# Patient Record
Sex: Male | Born: 1945 | Race: Black or African American | Hispanic: No | Smoking: Never smoker
Health system: Southern US, Community
[De-identification: ages and names within clinical notes are randomized; demographics above are authoritative.]

## PROBLEM LIST (undated history)

## (undated) DIAGNOSIS — H6120 Impacted cerumen, unspecified ear: Secondary | ICD-10-CM

## (undated) DIAGNOSIS — I10 Essential (primary) hypertension: Secondary | ICD-10-CM

## (undated) DIAGNOSIS — N400 Enlarged prostate without lower urinary tract symptoms: Secondary | ICD-10-CM

## (undated) DIAGNOSIS — F419 Anxiety disorder, unspecified: Secondary | ICD-10-CM

## (undated) DIAGNOSIS — F32A Depression, unspecified: Secondary | ICD-10-CM

## (undated) DIAGNOSIS — I639 Cerebral infarction, unspecified: Secondary | ICD-10-CM

## (undated) DIAGNOSIS — E669 Obesity, unspecified: Secondary | ICD-10-CM

## (undated) DIAGNOSIS — F329 Major depressive disorder, single episode, unspecified: Secondary | ICD-10-CM

## (undated) DIAGNOSIS — E7800 Pure hypercholesterolemia, unspecified: Secondary | ICD-10-CM

## (undated) HISTORY — DX: Impacted cerumen, unspecified ear: H61.20

## (undated) HISTORY — DX: Depression, unspecified: F32.A

## (undated) HISTORY — DX: Obesity, unspecified: E66.9

## (undated) HISTORY — DX: Pure hypercholesterolemia, unspecified: E78.00

## (undated) HISTORY — DX: Essential (primary) hypertension: I10

## (undated) HISTORY — DX: Major depressive disorder, single episode, unspecified: F32.9

## (undated) HISTORY — DX: Anxiety disorder, unspecified: F41.9

## (undated) HISTORY — DX: Benign prostatic hyperplasia without lower urinary tract symptoms: N40.0

---

## 1995-03-27 HISTORY — PX: TOTAL HIP ARTHROPLASTY: SHX124

## 2002-07-23 ENCOUNTER — Ambulatory Visit (HOSPITAL_COMMUNITY): Admission: RE | Admit: 2002-07-23 | Discharge: 2002-07-23 | Payer: Self-pay | Admitting: Family Medicine

## 2002-07-23 ENCOUNTER — Encounter: Payer: Self-pay | Admitting: Family Medicine

## 2002-08-05 ENCOUNTER — Ambulatory Visit (HOSPITAL_COMMUNITY): Admission: RE | Admit: 2002-08-05 | Discharge: 2002-08-05 | Payer: Self-pay | Admitting: Orthopaedic Surgery

## 2002-08-05 ENCOUNTER — Encounter: Payer: Self-pay | Admitting: Orthopaedic Surgery

## 2003-02-08 ENCOUNTER — Ambulatory Visit (HOSPITAL_COMMUNITY): Admission: RE | Admit: 2003-02-08 | Discharge: 2003-02-08 | Payer: Self-pay | Admitting: Family Medicine

## 2003-09-06 ENCOUNTER — Ambulatory Visit (HOSPITAL_COMMUNITY): Admission: RE | Admit: 2003-09-06 | Discharge: 2003-09-06 | Payer: Self-pay | Admitting: Family Medicine

## 2003-09-20 ENCOUNTER — Ambulatory Visit (HOSPITAL_COMMUNITY): Admission: RE | Admit: 2003-09-20 | Discharge: 2003-09-20 | Payer: Self-pay | Admitting: Family Medicine

## 2003-09-29 ENCOUNTER — Ambulatory Visit (HOSPITAL_COMMUNITY): Admission: RE | Admit: 2003-09-29 | Discharge: 2003-09-29 | Payer: Self-pay | Admitting: Family Medicine

## 2003-11-05 ENCOUNTER — Encounter (HOSPITAL_COMMUNITY): Admission: RE | Admit: 2003-11-05 | Discharge: 2003-12-05 | Payer: Self-pay | Admitting: Neurosurgery

## 2004-03-26 HISTORY — PX: COLONOSCOPY: SHX174

## 2004-11-01 ENCOUNTER — Ambulatory Visit: Payer: Self-pay | Admitting: Internal Medicine

## 2004-11-14 ENCOUNTER — Ambulatory Visit: Payer: Self-pay | Admitting: Internal Medicine

## 2004-11-14 ENCOUNTER — Ambulatory Visit (HOSPITAL_COMMUNITY): Admission: RE | Admit: 2004-11-14 | Discharge: 2004-11-14 | Payer: Self-pay | Admitting: Internal Medicine

## 2006-01-28 ENCOUNTER — Emergency Department (HOSPITAL_COMMUNITY): Admission: EM | Admit: 2006-01-28 | Discharge: 2006-01-28 | Payer: Self-pay | Admitting: *Deleted

## 2006-08-07 ENCOUNTER — Emergency Department (HOSPITAL_COMMUNITY): Admission: EM | Admit: 2006-08-07 | Discharge: 2006-08-07 | Payer: Self-pay | Admitting: *Deleted

## 2010-08-11 NOTE — Consult Note (Signed)
NAME:  Stanley Wall, Stanley Wall              ACCOUNT NO.:  [id number]   MEDICAL RECORD NO.:  [id number]         PATIENT TYPE:  AMB   LOCATION:                                FACILITY:  [facility]   PHYSICIAN:  R. Roetta Sessions, M.D. DATE OF BIRTH:  28-Feb-1946   DATE OF CONSULTATION:  11/01/2004  DATE OF DISCHARGE:                                   CONSULTATION   REASON FOR CONSULTATION:  Intermittent hematochezia.   HISTORY OF PRESENT ILLNESS:  Stanley Wall is a 65 year old, African-  American male who reports a 4- to 21-month history of bubbling sensation to  his entire abdomen.  He notes increased bowel sounds.  He also complains of  constipation going anywhere from 2-3 days without a bowel movement.  He has  been taking black draw for his constipation.  He did notice a small amount  of bright red blood in his stool about 3 days ago.  Prior to this, he was  having daily bowel movements.  He denies any nausea, vomiting or diarrhea.  He denies any indigestion, dysphagia, odynophagia or heartburn currently,  although he has history of heartburn and atypical chest pain which is well-  controlled on Protonix for the last 2 years.   PAST MEDICAL HISTORY:  1.  Hypertension.  2.  GERD.  3.  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  4.  Oral surgery.  5.  Hip surgery in 1997.   CURRENT MEDICATIONS:  1.  Protonix 40 mg daily.  2.  Diovan 80 mg daily.  3.  Restoril 4 mg q.h.s.  4.  Hydroxyzine HCl 10 mg q.6h.  5.  Norvasc 10 mg daily.  6.  Celebrex 200 mg b.i.d.  7.  Hyoscyamine 0.375 mg b.i.d. p.r.n.  8.  Xanax 0.5 mg q.6h. p.r.n.  9.  Gas-X p.r.n.  10. Fluocinonide cream 0.5% b.i.d.  11. Diprolene AP p.r.n.  12. Nystatin t.i.d.  13. Tylenol p.r.n.  14. Aspirin 325 mg p.r.n.   ALLERGIES:  No known drug allergies.   FAMILY HISTORY:  No family history of colorectal carcinoma, liver or chronic  GI problems.   SOCIAL HISTORY:  Stanley Wall is currently divorced.  He lives alone.  He  has one grown,  healthy son.  He is retired from [employer].  He denies any  tobacco, alcohol or drug use.   REVIEW OF SYSTEMS:  CONSTITUTIONAL:  Weight is steadily increasingly.  Denies any fatigue, denies fevers and chills.  He does have insomnia.  CARDIOVASCULAR:  Denies any chest pain or palpitations.  PULMONARY:  Denies  shortness of breath, dyspnea, cough or hemoptysis.  GASTROINTESTINAL:  See  HPI.   PHYSICAL EXAMINATION:  VITAL SIGNS:  Weight 301 pounds, height 69 inches,  temperature 98.3, blood pressure 144/88 and pulse 76.  GENERAL:  Stanley Wall is a 65 year old, obese, African-American male who is  alert, pleasant and cooperative in no acute distress.  HEENT:  Sclerae clear.  Nonicteric.  Conjunctivae pink.  Oropharynx pink and  moist without any lesions.  NECK:  Supple without any masses or thyromegaly.  CHEST:  Heart regular rate and  rhythm with normal S1, S2 without murmurs,  rubs or gallops.  LUNGS:  Clear to auscultation bilaterally.  ABDOMEN:  Protuberant and distended with positive, very active bowel sounds  x4.  No bruits auscultated.  Soft, nontender without any masses or  hepatosplenomegaly.  No rebound tenderness or guarding.  RECTAL:  Deferred.  EXTREMITIES:  1+ lower extremity edema bilaterally.   IMPRESSION:  Stanley Wall is a 65 year old, African-American male with  intermittent hematochezia.  He has never had a screening colonoscopy and I  feel at this time he should undergo exam to rule out colorectal carcinoma.  He also reports history of constipation.  It is possible that he could be  having intermittent obstruction as well.   PLAN:  1.  Will schedule colonoscopy with Dr. Jena Gauss in the near future.  I have      discussed this procedure including risks and benefits including, but not      limited to bleeding, infection, perforation or drug reaction giving      informed consent.  2.  Recommendations to follow.   We would like to thank Dr. Regino Schultze for allowing [place] to  participate in the  care of Stanley Wall.      Nicholas Lose, N.P.      Jonathon Bellows, M.D.  Electronically Signed    KC/MEDQ  D:  11/01/2004  T:  11/01/2004  Job:  [id number]

## 2010-08-11 NOTE — Op Note (Signed)
NAME:  Stanley Wall, Stanley Wall              ACCOUNT NO.:  [id number]   MEDICAL RECORD NO.:  [id number]          PATIENT TYPE:  AMB   LOCATION:  DAY                           FACILITY:  [facility]   PHYSICIAN:  R. Roetta Sessions, M.D. DATE OF BIRTH:  April 18, 1945   DATE OF PROCEDURE:  11/14/2004  DATE OF DISCHARGE:                                 OPERATIVE REPORT   PROCEDURE:  Diagnostic colonoscopy.   INDICATIONS FOR PROCEDURE:  A 65 year old African-American male with  hematochezia. Occasionally has this in the setting of constipation. There  has been no abdominal pain. He has never had his lower GI tract imaged.  There is no family history of colorectal neoplasia. Colonoscopy is now being  done. This approach has been discussed with the patient at length. Potential  risks, benefits, and alternatives have been reviewed and questions answered.  He is agreeable. Please see documentation in the medical record.   PROCEDURE NOTE:  O2 saturation, blood pressure, pulse, and respirations were  monitored throughout the entire procedure. Conscious sedation with Versed 3  mg IV and Demerol 75 mg IV in divided doses.   INSTRUMENT:  Olympus video chip system.   FINDINGS:  Digital rectal exam revealed no abnormalities.   ENDOSCOPIC FINDINGS:  Prep was good.   Rectum:  Examination of the rectal mucosa including retroflexed view of the  anal verge revealed no abnormalities although there appeared to be some  minimal anal canal hemorrhoid tissue ______________ as the anal canal was  traversed.   Colon:  Colonic mucosa was surveyed from the rectosigmoid junction through  the left, transverse, and right colon to the area of the appendiceal  orifice, ileocecal valve, and cecum. These structures were well seen and  photographed for the record. From this level, the scope was slowly  withdrawn, and all previously mentioned mucosal surfaces were again seen.  The colonic mucosa appeared normal. The patient tolerated  the procedure well  and was reactive to endoscopy.   IMPRESSION:  Anal canal hemorrhoids. Otherwise rectum. Normal colon. I  suspect the patient's had bled from anal canal hemorrhoids intermittent.   RECOMMENDATIONS:  1.  Hemorrhoid and constipation literature provided to Mr. Preiss.  2.  Anusol HC suppositories 1 per rectum at bedtime for 10 days.  3.  MiraLax laxatives 17 g orally at bedtime p.r.n. constipation.  4.  If bleeding does not resolve, he is to let me know.      Stanley Wall, M.D.  Electronically Signed     RMR/MEDQ  D:  11/14/2004  T:  11/14/2004  Job:  [id number]

## 2014-11-17 ENCOUNTER — Telehealth: Payer: Self-pay

## 2014-11-17 NOTE — Telephone Encounter (Signed)
Pt was referred by Dr. Phillips Odor for screening colonoscopy. Last one was 11/14/2004 by Dr. Jena Gauss. I have scheduled him an OV with Wynne Dust, NP on 11/22/2014 at 10:00 Am.

## 2014-11-22 ENCOUNTER — Ambulatory Visit (INDEPENDENT_AMBULATORY_CARE_PROVIDER_SITE_OTHER): Payer: Medicare Other | Admitting: Nurse Practitioner

## 2014-11-22 ENCOUNTER — Encounter: Payer: Self-pay | Admitting: Nurse Practitioner

## 2014-11-22 ENCOUNTER — Other Ambulatory Visit: Payer: Self-pay

## 2014-11-22 VITALS — BP 139/88 | HR 68 | Temp 98.1°F | Ht 69.0 in | Wt 242.8 lb

## 2014-11-22 DIAGNOSIS — Z1211 Encounter for screening for malignant neoplasm of colon: Secondary | ICD-10-CM | POA: Insufficient documentation

## 2014-11-22 DIAGNOSIS — K5900 Constipation, unspecified: Secondary | ICD-10-CM | POA: Diagnosis not present

## 2014-11-22 DIAGNOSIS — K921 Melena: Secondary | ICD-10-CM | POA: Insufficient documentation

## 2014-11-22 MED ORDER — PEG-KCL-NACL-NASULF-NA ASC-C 100 G PO SOLR: 1.0000 | ORAL | Status: AC

## 2014-11-22 NOTE — Assessment & Plan Note (Signed)
Last colonoscopy 11/14/2004 for hematochezia with constipation, findings include normal rectum and colon, likely hematochezia due to hemorrhoids and constipation. At this point he is due for a 10 year screening colonoscopy.  Proceed with TCS in the OR with propofol with Dr. Jena Gauss in near future: the risks, benefits, and alternatives have been discussed with the patient in detail. The patient states understanding and desires to proceed.  Patient is not on any anticoagulants, he is on Xanax twice daily, Risperdal, and Celexa. Due to polypharmacy we will recommend procedure and the OR with propofol to ensure adequate sedation.

## 2014-11-22 NOTE — Assessment & Plan Note (Signed)
Patient with chronic constipation currently well-controlled on over-the-counter Dulcolax. Has occasional worsening of constipation. Recommend he continue taking when necessary Dulcolax to prevent constipation.

## 2014-11-22 NOTE — Assessment & Plan Note (Signed)
Patient with rare, scant toilet tissue hematochezia in the setting of constipation with known hemorrhoids. Last episode 2 months ago. He is on Dulcolax for his constipation symptoms. Toilet tissue hematochezia likely due to hemorrhoids and constipation. However he is due for a 10 year screening colonoscopy. Continue Dulcolax as needed.

## 2014-11-22 NOTE — Patient Instructions (Signed)
1. Continue taking the Dulcolax to help keep her constipation under control. 2. We will schedule your procedure for you. 3. Further recommendations to be based on results of your procedure.

## 2014-11-22 NOTE — Progress Notes (Signed)
Primary Care Physician:  Lenise Herald, PA-C Primary Gastroenterologist:  Dr. Jena Gauss  Chief Complaint  Patient presents with  . set up TCS    HPI:   69 year old male referred by PCP for colonoscopy. PCP note reviewed. Patient was brought in for an office visit rather than from triage due to concerns by medications. Last colonoscopy completed 11/14/2004 for complaints of occasional hematochezia in the setting of constipation. On exam include anal canal hemorrhoids, otherwise normal rectum, normal colon, likely hematochezia from hemorrhoidal bleeding. Was given MiraLAX, Anusol suppositories.   Today he states he's doing well overall. Denies abdominal pain, N/V, hematochezia as of late, last episode of hematochezia about 2 months ago which was scant toilet tissue hematochezia. Denies other hemorrhoid symptoms. Constipation symptoms generally well controlled, currently taking an OTC dulcolax prn. Denies melena, unintentional weight loss, unexplained fevers. Denies chest pain, dyspnea, dizziness, lightheadedness, syncope, near syncope. Denies any other upper or lower GI symptoms.   No past medical history on file.  No past surgical history on file.  Current Outpatient Prescriptions  Medication Sig Dispense Refill  . ALPRAZolam (XANAX) 0.5 MG tablet Take 0.5 mg by mouth 2 (two) times daily.    Marland Kitchen amLODipine (NORVASC) 10 MG tablet Take 10 mg by mouth daily.  1  . atorvastatin (LIPITOR) 10 MG tablet Take 10 mg by mouth daily.  0  . betamethasone dipropionate (DIPROLENE) 0.05 % cream Apply topically 2 (two) times daily as needed.  1  . celecoxib (CELEBREX) 200 MG capsule Take 400 mg by mouth daily.  3  . mupirocin ointment (BACTROBAN) 2 % Place 1 application into the nose 2 (two) times daily.    Marland Kitchen NAFTIN 1 % GEL APPLY GEL TO FEET DAILY AS DIRECTED  11  . QUEtiapine (SEROQUEL) 100 MG tablet Take 100 mg by mouth 2 (two) times daily.  3  . risperiDONE (RISPERDAL) 2 MG tablet Take 2 mg by mouth at  bedtime.  0  . tamsulosin (FLOMAX) 0.4 MG CAPS capsule Take 0.4 mg by mouth daily.  3  . valsartan (DIOVAN) 160 MG tablet Take 160 mg by mouth daily.  3   No current facility-administered medications for this visit.    Allergies as of 11/22/2014  . (No Known Allergies)    Family History  Problem Relation Age of Onset  . Colon cancer Neg Hx     Social History   Social History  . Marital Status: Divorced    Spouse Name: N/A  . Number of Children: N/A  . Years of Education: N/A   Occupational History  . Not on file.   Social History Main Topics  . Smoking status: Never Smoker   . Smokeless tobacco: Never Used  . Alcohol Use: No  . Drug Use: No  . Sexual Activity: Not on file   Other Topics Concern  . Not on file   Social History Narrative  . No narrative on file    Review of Systems: General: Negative for anorexia, weight loss, fever, chills, fatigue, weakness. Eyes: Negative for vision changes.  ENT: Negative for hoarseness, difficulty swallowing. CV: Negative for chest pain, angina, palpitations, peripheral edema.  Respiratory: Negative for dyspnea at rest, cough, sputum, wheezing.  GI: See history of present illness. Derm: Negative for rash or itching.  Endo: Negative for unusual weight change.  Heme: Negative for bruising or bleeding. Allergy: Negative for rash or hives.    Physical Exam: BP 139/88 mmHg  Pulse 68  Temp(Src) 98.1  F (36.7 C)  Ht  (1.753 m)  Wt 242 lb 12.8 oz (110.133 kg)  BMI 35.84 kg/m2 General:   Alert and oriented. Pleasant and cooperative. Well-nourished and well-developed.  Head:  Normocephalic and atraumatic. Eyes:  Without icterus, sclera clear and conjunctiva pink.  Ears:  Normal auditory acuity. Cardiovascular:  S1, S2 present with systolic murmur appreciated. . Extremities without clubbing or edema noted. Respiratory:  Clear to auscultation bilaterally. No wheezes, rales, or rhonchi. No distress.  Gastrointestinal:   +BS, rounded but soft, non-tender and non-distended. No HSM noted. No guarding or rebound. No masses appreciated.  Rectal:  Deferred  Musculoskalatal:  Hobbling gait noted, denies pain. Skin:  Intact without significant lesions or rashes. Neurologic:  Alert and oriented x4;  grossly normal neurologically. Psych:  Alert and cooperative. Normal mood and affect. Heme/Lymph/Immune: No excessive bruising noted.    11/22/2014 10:43 AM

## 2014-11-23 NOTE — Progress Notes (Signed)
CC'ED TO PCP 

## 2014-12-03 NOTE — Patient Instructions (Signed)
Stanley Wall  12/03/2014     @PREFPERIOPPHARMACY @   Your procedure is scheduled on  12/09/2014  Report to [facility] at  730  A.M.  Call this number if you have problems the morning of surgery:  [phone]   Remember:  Do not eat food or drink liquids after midnight.  Take these medicines the morning of surgery with A SIP OF WATER  Xanax, norvasc, celebrex, seroquel, fomax, valsartan.   Do not wear jewelry, make-up or nail polish.  Do not wear lotions, powders, or perfumes.  You may wear deodorant.  Do not shave 48 hours prior to surgery.  Men may shave face and neck.  Do not bring valuables to the hospital.  [facility] is not responsible for any belongings or valuables.  Contacts, dentures or bridgework may not be worn into surgery.  Leave your suitcase in the car.  After surgery it may be brought to your room.  For patients admitted to the hospital, discharge time will be determined by your treatment team.  Patients discharged the day of surgery will not be allowed to drive home.   Name and phone number of your driver:   family Special instructions:  Follow the diet and prep instructions given to you by Dr Luvenia Starch office.  Please read over the following fact sheets that you were given. Pain Booklet, Coughing and Deep Breathing, Surgical Site Infection Prevention, Anesthesia Post-op Instructions and Care and Recovery After Surgery      Colonoscopy A colonoscopy is an exam to look at the entire large intestine (colon). This exam can help find problems such as tumors, polyps, inflammation, and areas of bleeding. The exam takes about 1 hour.  LET [facility] CARE PROVIDER KNOW ABOUT:   Any allergies you have.  All medicines you are taking, including vitamins, herbs, eye drops, creams, and over-the-counter medicines.  Previous problems you or members of your family have had with the use of anesthetics.  Any blood disorders you have.  Previous surgeries you  have had.  Medical conditions you have. RISKS AND COMPLICATIONS  Generally, this is a safe procedure. However, as with any procedure, complications can occur. Possible complications include:  Bleeding.  Tearing or rupture of the colon wall.  Reaction to medicines given during the exam.  Infection (rare). BEFORE THE PROCEDURE   Ask your health care provider about changing or stopping your regular medicines.  You may be prescribed an oral bowel prep. This involves drinking a large amount of medicated liquid, starting the day before your procedure. The liquid will cause you to have multiple loose stools until your stool is almost clear or light green. This cleans out your colon in preparation for the procedure.  Do not eat or drink anything else once you have started the bowel prep, unless your health care provider tells you it is safe to do so.  Arrange for someone to drive you home after the procedure. PROCEDURE   You will be given medicine to help you relax (sedative).  You will lie on your side with your knees bent.  A long, flexible tube with a light and camera on the end (colonoscope) will be inserted through the rectum and into the colon. The camera sends video back to a computer screen as it moves through the colon. The colonoscope also releases carbon dioxide gas to inflate the colon. This helps your health care provider see the area better.  During the exam, your  health care provider may take a small tissue sample (biopsy) to be examined under a microscope if any abnormalities are found.  The exam is finished when the entire colon has been viewed. AFTER THE PROCEDURE   Do not drive for 24 hours after the exam.  You may have a small amount of blood in your stool.  You may pass moderate amounts of gas and have mild abdominal cramping or bloating. This is caused by the gas used to inflate your colon during the exam.  Ask when your test results will be ready and how you  will get your results. Make sure you get your test results. Document Released: 03/09/2000 Document Revised: 12/31/2012 Document Reviewed: 11/17/2012 [facility] Patient Information 2015 [place], [place]. This information is not intended to replace advice given to you by your health care provider. Make sure you discuss any questions you have with your health care provider. PATIENT INSTRUCTIONS POST-ANESTHESIA  IMMEDIATELY FOLLOWING SURGERY:  Do not drive or operate machinery for the first twenty four hours after surgery.  Do not make any important decisions for twenty four hours after surgery or while taking narcotic pain medications or sedatives.  If you develop intractable nausea and vomiting or a severe headache please notify your doctor immediately.  FOLLOW-UP:  Please make an appointment with your surgeon as instructed. You do not need to follow up with anesthesia unless specifically instructed to do so.  WOUND CARE INSTRUCTIONS (if applicable):  Keep a dry clean dressing on the anesthesia/puncture wound site if there is drainage.  Once the wound has quit draining you may leave it open to air.  Generally you should leave the bandage intact for twenty four hours unless there is drainage.  If the epidural site drains for more than 36-48 hours please call the anesthesia department.  QUESTIONS?:  Please feel free to call your physician or the hospital operator if you have any questions, and they will be happy to assist you.

## 2014-12-06 ENCOUNTER — Other Ambulatory Visit: Payer: Self-pay

## 2014-12-06 ENCOUNTER — Encounter (HOSPITAL_COMMUNITY)
Admission: RE | Admit: 2014-12-06 | Discharge: 2014-12-06 | Disposition: A | Discharge status: Home / Self Care | Payer: Medicare Other | Source: Ambulatory Visit | Attending: Internal Medicine | Admitting: Internal Medicine

## 2014-12-06 ENCOUNTER — Encounter (HOSPITAL_COMMUNITY): Payer: Self-pay

## 2014-12-06 DIAGNOSIS — Q438 Other specified congenital malformations of intestine: Secondary | ICD-10-CM | POA: Diagnosis not present

## 2014-12-06 DIAGNOSIS — K648 Other hemorrhoids: Secondary | ICD-10-CM | POA: Diagnosis not present

## 2014-12-06 DIAGNOSIS — I10 Essential (primary) hypertension: Secondary | ICD-10-CM | POA: Diagnosis not present

## 2014-12-06 DIAGNOSIS — F418 Other specified anxiety disorders: Secondary | ICD-10-CM | POA: Diagnosis not present

## 2014-12-06 DIAGNOSIS — Z01810 Encounter for preprocedural cardiovascular examination: Secondary | ICD-10-CM | POA: Diagnosis not present

## 2014-12-06 DIAGNOSIS — Z01812 Encounter for preprocedural laboratory examination: Secondary | ICD-10-CM | POA: Diagnosis not present

## 2014-12-06 DIAGNOSIS — Z79899 Other long term (current) drug therapy: Secondary | ICD-10-CM | POA: Diagnosis not present

## 2014-12-06 DIAGNOSIS — Z1211 Encounter for screening for malignant neoplasm of colon: Secondary | ICD-10-CM | POA: Diagnosis present

## 2014-12-06 HISTORY — DX: Cerebral infarction, unspecified: I63.9

## 2014-12-06 LAB — CBC WITH DIFFERENTIAL/PLATELET
Basophils Absolute: 0 10*3/uL (ref 0.0–0.1)
Basophils Relative: 0 % (ref 0–1)
Eosinophils Absolute: 0.1 10*3/uL (ref 0.0–0.7)
Eosinophils Relative: 3 % (ref 0–5)
HCT: 42.4 % (ref 39.0–52.0)
Hemoglobin: 14.1 g/dL (ref 13.0–17.0)
Lymphocytes Relative: 25 % (ref 12–46)
Lymphs Abs: 1.2 10*3/uL (ref 0.7–4.0)
MCH: 30.7 pg (ref 26.0–34.0)
MCHC: 33.3 g/dL (ref 30.0–36.0)
MCV: 92.2 fL (ref 78.0–100.0)
Monocytes Absolute: 0.6 10*3/uL (ref 0.1–1.0)
Monocytes Relative: 12 % (ref 3–12)
Neutro Abs: 2.8 10*3/uL (ref 1.7–7.7)
Neutrophils Relative %: 60 % (ref 43–77)
Platelets: 225 10*3/uL (ref 150–400)
RBC: 4.6 MIL/uL (ref 4.22–5.81)
RDW: 14.6 % (ref 11.5–15.5)
WBC: 4.7 10*3/uL (ref 4.0–10.5)

## 2014-12-06 LAB — BASIC METABOLIC PANEL
Anion gap: 5 (ref 5–15)
BUN: 11 mg/dL (ref 6–20)
CO2: 26 mmol/L (ref 22–32)
Calcium: 8.5 mg/dL — ABNORMAL LOW (ref 8.9–10.3)
Chloride: 106 mmol/L (ref 101–111)
Creatinine, Ser: 0.96 mg/dL (ref 0.61–1.24)
GFR calc Af Amer: >60 mL/min (ref >60)
GFR calc non Af Amer: >60 mL/min (ref >60)
Glucose, Bld: 90 mg/dL (ref 65–99)
Potassium: 4.2 mmol/L (ref 3.5–5.1)
Sodium: 137 mmol/L (ref 135–145)

## 2014-12-06 NOTE — Pre-Procedure Instructions (Signed)
Patient given information to sign up for my chart at home. 

## 2014-12-09 ENCOUNTER — Ambulatory Visit (HOSPITAL_COMMUNITY): Payer: Medicare Other | Admitting: Anesthesiology

## 2014-12-09 ENCOUNTER — Ambulatory Visit (HOSPITAL_COMMUNITY)
Admission: RE | Admit: 2014-12-09 | Discharge: 2014-12-09 | Disposition: A | Discharge status: Home Health | Payer: Medicare Other | Source: Ambulatory Visit | Attending: Internal Medicine | Admitting: Internal Medicine

## 2014-12-09 ENCOUNTER — Encounter (HOSPITAL_COMMUNITY): Payer: Self-pay | Admitting: Certified Registered Nurse Anesthetist

## 2014-12-09 ENCOUNTER — Encounter (HOSPITAL_COMMUNITY)
Admission: RE | Disposition: A | Discharge status: Home Health | Payer: Self-pay | Source: Ambulatory Visit | Attending: Internal Medicine

## 2014-12-09 DIAGNOSIS — Q438 Other specified congenital malformations of intestine: Secondary | ICD-10-CM | POA: Insufficient documentation

## 2014-12-09 DIAGNOSIS — Z1211 Encounter for screening for malignant neoplasm of colon: Secondary | ICD-10-CM | POA: Diagnosis not present

## 2014-12-09 DIAGNOSIS — I10 Essential (primary) hypertension: Secondary | ICD-10-CM | POA: Diagnosis not present

## 2014-12-09 DIAGNOSIS — K648 Other hemorrhoids: Secondary | ICD-10-CM | POA: Insufficient documentation

## 2014-12-09 DIAGNOSIS — F418 Other specified anxiety disorders: Secondary | ICD-10-CM | POA: Insufficient documentation

## 2014-12-09 DIAGNOSIS — Z79899 Other long term (current) drug therapy: Secondary | ICD-10-CM | POA: Insufficient documentation

## 2014-12-09 DIAGNOSIS — Z01812 Encounter for preprocedural laboratory examination: Secondary | ICD-10-CM | POA: Insufficient documentation

## 2014-12-09 DIAGNOSIS — Z01810 Encounter for preprocedural cardiovascular examination: Secondary | ICD-10-CM | POA: Insufficient documentation

## 2014-12-09 HISTORY — PX: COLONOSCOPY WITH PROPOFOL: SHX5780

## 2014-12-09 SURGERY — COLONOSCOPY WITH PROPOFOL
Anesthesia: Monitor Anesthesia Care

## 2014-12-09 MED ORDER — MIDAZOLAM HCL 2 MG/2ML IJ SOLN
INTRAMUSCULAR | Status: AC
  Filled 2014-12-09: qty 2

## 2014-12-09 MED ORDER — FENTANYL CITRATE (PF) 100 MCG/2ML IJ SOLN
INTRAMUSCULAR | Status: AC
  Filled 2014-12-09: qty 4

## 2014-12-09 MED ORDER — PROPOFOL INFUSION 10 MG/ML OPTIME
INTRAVENOUS | Status: DC | PRN
  Administered 2014-12-09: 80 ug/kg/min via INTRAVENOUS

## 2014-12-09 MED ORDER — STERILE WATER FOR IRRIGATION IR SOLN
Status: DC | PRN
  Administered 2014-12-09: 1000 mL

## 2014-12-09 MED ORDER — PROPOFOL 10 MG/ML IV BOLUS
INTRAVENOUS | Status: AC
  Filled 2014-12-09: qty 20

## 2014-12-09 MED ORDER — ONDANSETRON HCL 4 MG/2ML IJ SOLN: 4.0000 mg | Freq: Once | INTRAMUSCULAR | Status: DC | PRN

## 2014-12-09 MED ORDER — MIDAZOLAM HCL 2 MG/2ML IJ SOLN
INTRAMUSCULAR | Status: AC
  Filled 2014-12-09: qty 4

## 2014-12-09 MED ORDER — GLYCOPYRROLATE 0.2 MG/ML IJ SOLN
0.2000 mg | Freq: Once | INTRAMUSCULAR | Status: AC
  Administered 2014-12-09: 0.2 mg via INTRAVENOUS

## 2014-12-09 MED ORDER — LIDOCAINE HCL (PF) 1 % IJ SOLN
INTRAMUSCULAR | Status: AC
  Filled 2014-12-09: qty 5

## 2014-12-09 MED ORDER — FENTANYL CITRATE (PF) 100 MCG/2ML IJ SOLN
INTRAMUSCULAR | Status: AC
  Filled 2014-12-09: qty 2

## 2014-12-09 MED ORDER — FENTANYL CITRATE (PF) 100 MCG/2ML IJ SOLN
25.0000 ug | INTRAMUSCULAR | Status: AC
  Administered 2014-12-09 (×2): 25 ug via INTRAVENOUS

## 2014-12-09 MED ORDER — FENTANYL CITRATE (PF) 100 MCG/2ML IJ SOLN: 25.0000 ug | INTRAMUSCULAR | Status: DC | PRN

## 2014-12-09 MED ORDER — ONDANSETRON HCL 4 MG/2ML IJ SOLN
4.0000 mg | Freq: Once | INTRAMUSCULAR | Status: AC
  Administered 2014-12-09: 4 mg via INTRAVENOUS

## 2014-12-09 MED ORDER — LIDOCAINE HCL 1 % IJ SOLN
INTRAMUSCULAR | Status: DC | PRN
  Administered 2014-12-09: 50 mg via INTRADERMAL

## 2014-12-09 MED ORDER — ONDANSETRON HCL 4 MG/2ML IJ SOLN
INTRAMUSCULAR | Status: AC
  Filled 2014-12-09: qty 2

## 2014-12-09 MED ORDER — GLYCOPYRROLATE 0.2 MG/ML IJ SOLN
INTRAMUSCULAR | Status: AC
  Filled 2014-12-09: qty 1

## 2014-12-09 MED ORDER — LACTATED RINGERS IV SOLN
INTRAVENOUS | Status: DC
  Administered 2014-12-09: 08:00:00 via INTRAVENOUS

## 2014-12-09 MED ORDER — MIDAZOLAM HCL 2 MG/2ML IJ SOLN
1.0000 mg | INTRAMUSCULAR | Status: DC | PRN
Start: 2014-12-09 — End: 2014-12-09
  Administered 2014-12-09 (×2): 2 mg via INTRAVENOUS
  Filled 2014-12-09: qty 2

## 2014-12-09 MED ORDER — PROPOFOL 10 MG/ML IV BOLUS
INTRAVENOUS | Status: DC | PRN
  Administered 2014-12-09: 80 mg via INTRAVENOUS

## 2014-12-09 SURGICAL SUPPLY — 23 items
ELECT REM PT RETURN 9FT ADLT (ELECTROSURGICAL)
ELECTRODE REM PT RTRN 9FT ADLT (ELECTROSURGICAL) IMPLANT
FCP BXJMBJMB 240X2.8X (CUTTING FORCEPS)
FLOOR PAD 36X40 (MISCELLANEOUS) ×3
FORCEPS BIOP RAD 4 LRG CAP 4 (CUTTING FORCEPS) IMPLANT
FORCEPS BIOP RJ4 240 W/NDL (CUTTING FORCEPS)
FORCEPS BXJMBJMB 240X2.8X (CUTTING FORCEPS) IMPLANT
FORMALIN 10 PREFIL 20ML (MISCELLANEOUS) IMPLANT
INJECTOR/SNARE I SNARE (MISCELLANEOUS) IMPLANT
KIT ENDO PROCEDURE PEN (KITS) ×3 IMPLANT
MANIFOLD NEPTUNE II (INSTRUMENTS) ×2 IMPLANT
NDL SCLEROTHERAPY 25GX240 (NEEDLE) IMPLANT
NEEDLE SCLEROTHERAPY 25GX240 (NEEDLE) IMPLANT
PAD FLOOR 36X40 (MISCELLANEOUS) IMPLANT
PROBE APC STR FIRE (PROBE) IMPLANT
PROBE INJECTION GOLD (MISCELLANEOUS)
PROBE INJECTION GOLD 7FR (MISCELLANEOUS) IMPLANT
SNARE ROTATE MED OVAL 20MM (MISCELLANEOUS) IMPLANT
SNARE SHORT THROW 13M SML OVAL (MISCELLANEOUS) IMPLANT
SYR 50ML LL SCALE MARK (SYRINGE) ×2 IMPLANT
TRAP SPECIMEN MUCOUS 40CC (MISCELLANEOUS) IMPLANT
TUBING IRRIGATION ENDOGATOR (MISCELLANEOUS) ×2 IMPLANT
WATER STERILE IRR 1000ML POUR (IV SOLUTION) ×2 IMPLANT

## 2014-12-09 NOTE — Anesthesia Postprocedure Evaluation (Signed)
  Anesthesia Post-op Note  Patient: Stanley Wall  Procedure(s) Performed: Procedure(s) with comments: COLONOSCOPY WITH PROPOFOL (N/A) - Cecum time in  0953  time out  1000   total time 7 minutes  Patient Location: PACU  Anesthesia Type:MAC  Level of Consciousness: awake, alert , oriented, pateint uncooperative and responds to stimulation  Airway and Oxygen Therapy: Patient Spontanous Breathing and Patient connected to face mask oxygen  Post-op Pain: none  Post-op Assessment: Post-op Vital signs reviewed, Patient's Cardiovascular Status Stable, Respiratory Function Stable, Patent Airway, No signs of Nausea or vomiting and Pain level controlled              Post-op Vital Signs: Reviewed and stable  Last Vitals:  Filed Vitals:   12/09/14 0936  BP: 125/87  Pulse:   Temp:   Resp: 16    Complications: No apparent anesthesia complications

## 2014-12-09 NOTE — Transfer of Care (Signed)
Immediate Anesthesia Transfer of Care Note  Patient: Stanley Wall  Procedure(s) Performed: Procedure(s) with comments: COLONOSCOPY WITH PROPOFOL (N/A) - Cecum time in  0953  time out  1000   total time 7 minutes  Patient Location: PACU  Anesthesia Type:MAC  Level of Consciousness: awake, alert , oriented and patient cooperative  Airway & Oxygen Therapy: Patient Spontanous Breathing and Patient connected to face mask oxygen  Post-op Assessment: Report given to RN, Post -op Vital signs reviewed and stable and Patient moving all extremities X 4  Post vital signs: Reviewed and stable  Last Vitals:  Filed Vitals:   12/09/14 0936  BP: 125/87  Pulse:   Temp:   Resp: 16    Complications: No apparent anesthesia complications

## 2014-12-09 NOTE — Interval H&P Note (Signed)
History and Physical Interval Note:  12/09/2014 9:32 AM  Stanley Wall  has presented today for surgery, with the diagnosis of screening  The various methods of treatment have been discussed with the patient and family. After consideration of risks, benefits and other options for treatment, the patient has consented to  Procedure(s) with comments: COLONOSCOPY WITH PROPOFOL (N/A) - 900 as a surgical intervention .  The patient's history has been reviewed, patient examined, no change in status, stable for surgery.  I have reviewed the patient's chart and labs.  Questions were answered to the patient's satisfaction.      No change. Screening colonoscopy per plan.  The risks, benefits, limitations, alternatives and imponderables have been reviewed with the patient. Questions have been answered. All parties are agreeable.   Eula Listen

## 2014-12-09 NOTE — Op Note (Signed)
[facility] [address] [place], [address]   COLONOSCOPY PROCEDURE REPORT  PATIENT: Stanley Wall, Stanley Wall  MR#: #[id number] BIRTHDATE: 10/13/1945 , 69  yrs. old GENDER: male ENDOSCOPIST: R.  Roetta Sessions, MD FACP [facility] REFERRED QI:ONGEXBMW Loreta Ave, PA-C PROCEDURE DATE:  01/03/15 PROCEDURE:   Colonoscopy, screening INDICATIONS:Average risk colorectal cancer screening examination. MEDICATIONS: Deep sedation per Dr.  [facility] ASA CLASS:       Class I  CONSENT: The risks, benefits, alternatives and imponderables including but not limited to bleeding, perforation as well as the possibility of a missed lesion have been reviewed.  The potential for biopsy, lesion removal, etc. have also been discussed. Questions have been answered.  All parties agreeable.  Please see the history and physical in the medical record for more information.  DESCRIPTION OF PROCEDURE:   After the risks benefits and alternatives of the procedure were thoroughly explained, informed consent was obtained.  The digital rectal exam revealed no abnormalities of the rectum.   The     endoscope was introduced through the anus and advanced to the cecum, which was identified by both the appendix and ileocecal valve. No adverse events experienced.   The quality of the prep was adequate  The instrument was then slowly withdrawn as the colon was fully examined. Estimated blood loss is zero unless otherwise noted in this procedure report.      COLON FINDINGS: Internal hemorrhoids; otherwise, normal-appearing rectal mucosa.  Redundant colon.  Normal-appearing colonic mucosa. Retroflexion was performed. .  Withdrawal time=7 minutes 0 seconds.  The scope was withdrawn and the procedure completed. COMPLICATIONS: There were no immediate complications.  ENDOSCOPIC IMPRESSION: Internal hemorrhoids. Redundant colon  RECOMMENDATIONS: Repeat screening colonoscopy in 10 years only if overall  health permits. Add Colace and MiraLAX to regimen for occasional constipation.  eSigned:  R. Roetta Sessions, MD [facility] 01-03-2015 10:06 AM   cc:  CPT CODES: ICD CODES:  The ICD and CPT codes recommended by this software are interpretations from the data that the clinical staff has captured with the software.  The verification of the translation of this report to the ICD and CPT codes and modifiers is the sole responsibility of the health care institution and practicing physician where this report was generated.  [employer] will not be held responsible for the validity of the ICD and CPT codes included on this report.  AMA assumes no liability for data contained or not contained herein. CPT is a Publishing rights manager of the [employer].

## 2014-12-09 NOTE — Anesthesia Preprocedure Evaluation (Signed)
Anesthesia Evaluation  Patient identified by MRN, date of birth, ID band Patient awake    Reviewed: Allergy & Precautions, NPO status , Patient's Chart, lab work & pertinent test results  Airway Mallampati: II  TM Distance: >3 FB     Dental  (+) Implants   Pulmonary neg pulmonary ROS,    breath sounds clear to auscultation       Cardiovascular hypertension, Pt. on medications  Rhythm:Regular Rate:Normal     Neuro/Psych PSYCHIATRIC DISORDERS Anxiety Depression CVA, No Residual Symptoms    GI/Hepatic negative GI ROS,   Endo/Other    Renal/GU      Musculoskeletal   Abdominal   Peds  Hematology   Anesthesia Other Findings   Reproductive/Obstetrics                             Anesthesia Physical Anesthesia Plan  ASA: III  Anesthesia Plan: MAC   Post-op Pain Management:    Induction: Intravenous  Airway Management Planned: Simple Face Mask  Additional Equipment:   Intra-op Plan:   Post-operative Plan:   Informed Consent: I have reviewed the patients History and Physical, chart, labs and discussed the procedure including the risks, benefits and alternatives for the proposed anesthesia with the patient or authorized representative who has indicated his/her understanding and acceptance.     Plan Discussed with:   Anesthesia Plan Comments:         Anesthesia Quick Evaluation

## 2014-12-09 NOTE — Discharge Instructions (Signed)
Colonoscopy Discharge Instructions  Read the instructions outlined below and refer to this sheet in the next few weeks. These discharge instructions provide you with general information on caring for yourself after you leave the hospital. Your doctor may also give you specific instructions. While your treatment has been planned according to the most current medical practices available, unavoidable complications occasionally occur. If you have any problems or questions after discharge, call Dr. Jena Gauss at [phone]. ACTIVITY  You may resume your regular activity, but move at a slower pace for the next 24 hours.   Take frequent rest periods for the next 24 hours.   Walking will help get rid of the air and reduce the bloated feeling in your belly (abdomen).   No driving for 24 hours (because of the medicine (anesthesia) used during the test).    Do not sign any important legal documents or operate any machinery for 24 hours (because of the anesthesia used during the test).  NUTRITION  Drink plenty of fluids.   You may resume your normal diet as instructed by your doctor.   Begin with a light meal and progress to your normal diet. Heavy or fried foods are harder to digest and may make you feel sick to your stomach (nauseated).   Avoid alcoholic beverages for 24 hours or as instructed.  MEDICATIONS  You may resume your normal medications unless your doctor tells you otherwise.  WHAT YOU CAN EXPECT TODAY  Some feelings of bloating in the abdomen.   Passage of more gas than usual.   Spotting of blood in your stool or on the toilet paper.  IF YOU HAD POLYPS REMOVED DURING THE COLONOSCOPY:  No aspirin products for 7 days or as instructed.   No alcohol for 7 days or as instructed.   Eat a soft diet for the next 24 hours.  FINDING OUT THE RESULTS OF YOUR TEST Not all test results are available during your visit. If your test results are not back during the visit, make an appointment  with your caregiver to find out the results. Do not assume everything is normal if you have not heard from your caregiver or the medical facility. It is important for you to follow up on all of your test results.  SEEK IMMEDIATE MEDICAL ATTENTION IF:  You have more than a spotting of blood in your stool.   Your belly is swollen (abdominal distention).   You are nauseated or vomiting.   You have a temperature over 101.   You have abdominal pain or discomfort that is severe or gets worse throughout the day.     Constipation and hemorrhoid information provided  Use Colace 100 mg twice daily as a stool softener  Try MiraLAX 17 g orally at bedtime as needed for constipation  Repeat screening colonoscopy in 10 years if overall health permits  Hemorrhoids Hemorrhoids are swollen veins around the rectum or anus. There are two types of hemorrhoids:   Internal hemorrhoids. These occur in the veins just inside the rectum. They may poke through to the outside and become irritated and painful.  External hemorrhoids. These occur in the veins outside the anus and can be felt as a painful swelling or hard lump near the anus. CAUSES  Pregnancy.   Obesity.   Constipation or diarrhea.   Straining to have a bowel movement.   Sitting for long periods on the toilet.  Heavy lifting or other activity that caused you to strain.  Anal intercourse. SYMPTOMS  Pain.   Anal itching or irritation.   Rectal bleeding.   Fecal leakage.   Anal swelling.   One or more lumps around the anus.  DIAGNOSIS  Your caregiver may be able to diagnose hemorrhoids by visual examination. Other examinations or tests that may be performed include:   Examination of the rectal area with a gloved hand (digital rectal exam).   Examination of anal canal using a small tube (scope).   A blood test if you have lost a significant amount of blood.  A test to look inside the colon (sigmoidoscopy or  colonoscopy). TREATMENT Most hemorrhoids can be treated at home. However, if symptoms do not seem to be getting better or if you have a lot of rectal bleeding, your caregiver may perform a procedure to help make the hemorrhoids get smaller or remove them completely. Possible treatments include:   Placing a rubber band at the base of the hemorrhoid to cut off the circulation (rubber band ligation).   Injecting a chemical to shrink the hemorrhoid (sclerotherapy).   Using a tool to burn the hemorrhoid (infrared light therapy).   Surgically removing the hemorrhoid (hemorrhoidectomy).   Stapling the hemorrhoid to block blood flow to the tissue (hemorrhoid stapling).  HOME CARE INSTRUCTIONS   Eat foods with fiber, such as whole grains, beans, nuts, fruits, and vegetables. Ask your doctor about taking products with added fiber in them (fibersupplements).  Increase fluid intake. Drink enough water and fluids to keep your urine clear or pale yellow.   Exercise regularly.   Go to the bathroom when you have the urge to have a bowel movement. Do not wait.   Avoid straining to have bowel movements.   Keep the anal area dry and clean. Use wet toilet paper or moist towelettes after a bowel movement.   Medicated creams and suppositories may be used or applied as directed.   Only take over-the-counter or prescription medicines as directed by your caregiver.   Take warm sitz baths for 15-20 minutes, 3-4 times a day to ease pain and discomfort.   Place ice packs on the hemorrhoids if they are tender and swollen. Using ice packs between sitz baths may be helpful.   Put ice in a plastic bag.   Place a towel between your skin and the bag.   Leave the ice on for 15-20 minutes, 3-4 times a day.   Do not use a donut-shaped pillow or sit on the toilet for long periods. This increases blood pooling and pain.  SEEK MEDICAL CARE IF:  You have increasing pain and swelling that is not  controlled by treatment or medicine.  You have uncontrolled bleeding.  You have difficulty or you are unable to have a bowel movement.  You have pain or inflammation outside the area of the hemorrhoids. MAKE SURE YOU:  Understand these instructions.  Will watch your condition.  Will get help right away if you are not doing well or get worse.  Constipation Constipation is when a person:  Poops (has a bowel movement) less than 3 times a week.  Has a hard time pooping.  Has poop that is dry, hard, or bigger than normal. HOME CARE   Eat foods with a lot of fiber in them. This includes fruits, vegetables, beans, and whole grains such as brown rice.  Avoid fatty foods and foods with a lot of sugar. This includes french fries, hamburgers, cookies, candy, and soda.  If you are not getting enough fiber from  food, take products with added fiber in them (supplements).  Drink enough fluid to keep your pee (urine) clear or pale yellow.  Exercise on a regular basis, or as told by your doctor.  Go to the restroom when you feel like you need to poop. Do not hold it.  Only take medicine as told by your doctor. Do not take medicines that help you poop (laxatives) without talking to your doctor first. GET HELP RIGHT AWAY IF:   You have bright red blood in your poop (stool).  Your constipation lasts more than 4 days or gets worse.  You have belly (abdominal) or butt (rectal) pain.  You have thin poop (as thin as a pencil).  You lose weight, and it cannot be explained. MAKE SURE YOU:   Understand these instructions.  Will watch your condition.  Will get help right away if you are not doing well or get worse.

## 2014-12-09 NOTE — H&P (View-Only) (Signed)
Primary Care Physician:  Lenise Herald, PA-C Primary Gastroenterologist:  Dr. Jena Gauss  Chief Complaint  Patient presents with  . set up TCS    HPI:   69 year old male referred by PCP for colonoscopy. PCP note reviewed. Patient was brought in for an office visit rather than from triage due to concerns by medications. Last colonoscopy completed 11/14/2004 for complaints of occasional hematochezia in the setting of constipation. On exam include anal canal hemorrhoids, otherwise normal rectum, normal colon, likely hematochezia from hemorrhoidal bleeding. Was given MiraLAX, Anusol suppositories.   Today he states he's doing well overall. Denies abdominal pain, N/V, hematochezia as of late, last episode of hematochezia about 2 months ago which was scant toilet tissue hematochezia. Denies other hemorrhoid symptoms. Constipation symptoms generally well controlled, currently taking an OTC dulcolax prn. Denies melena, unintentional weight loss, unexplained fevers. Denies chest pain, dyspnea, dizziness, lightheadedness, syncope, near syncope. Denies any other upper or lower GI symptoms.   No past medical history on file.  No past surgical history on file.  Current Outpatient Prescriptions  Medication Sig Dispense Refill  . ALPRAZolam (XANAX) 0.5 MG tablet Take 0.5 mg by mouth 2 (two) times daily.    Marland Kitchen amLODipine (NORVASC) 10 MG tablet Take 10 mg by mouth daily.  1  . atorvastatin (LIPITOR) 10 MG tablet Take 10 mg by mouth daily.  0  . betamethasone dipropionate (DIPROLENE) 0.05 % cream Apply topically 2 (two) times daily as needed.  1  . celecoxib (CELEBREX) 200 MG capsule Take 400 mg by mouth daily.  3  . mupirocin ointment (BACTROBAN) 2 % Place 1 application into the nose 2 (two) times daily.    Marland Kitchen NAFTIN 1 % GEL APPLY GEL TO FEET DAILY AS DIRECTED  11  . QUEtiapine (SEROQUEL) 100 MG tablet Take 100 mg by mouth 2 (two) times daily.  3  . risperiDONE (RISPERDAL) 2 MG tablet Take 2 mg by mouth at  bedtime.  0  . tamsulosin (FLOMAX) 0.4 MG CAPS capsule Take 0.4 mg by mouth daily.  3  . valsartan (DIOVAN) 160 MG tablet Take 160 mg by mouth daily.  3   No current facility-administered medications for this visit.    Allergies as of 11/22/2014  . (No Known Allergies)    Family History  Problem Relation Age of Onset  . Colon cancer Neg Hx     Social History   Social History  . Marital Status: Divorced    Spouse Name: N/A  . Number of Children: N/A  . Years of Education: N/A   Occupational History  . Not on file.   Social History Main Topics  . Smoking status: Never Smoker   . Smokeless tobacco: Never Used  . Alcohol Use: No  . Drug Use: No  . Sexual Activity: Not on file   Other Topics Concern  . Not on file   Social History Narrative  . No narrative on file    Review of Systems: General: Negative for anorexia, weight loss, fever, chills, fatigue, weakness. Eyes: Negative for vision changes.  ENT: Negative for hoarseness, difficulty swallowing. CV: Negative for chest pain, angina, palpitations, peripheral edema.  Respiratory: Negative for dyspnea at rest, cough, sputum, wheezing.  GI: See history of present illness. Derm: Negative for rash or itching.  Endo: Negative for unusual weight change.  Heme: Negative for bruising or bleeding. Allergy: Negative for rash or hives.    Physical Exam: BP 139/88 mmHg  Pulse 68  Temp(Src) 98.1  F (36.7 C)  Ht  (1.753 m)  Wt 242 lb 12.8 oz (110.133 kg)  BMI 35.84 kg/m2 General:   Alert and oriented. Pleasant and cooperative. Well-nourished and well-developed.  Head:  Normocephalic and atraumatic. Eyes:  Without icterus, sclera clear and conjunctiva pink.  Ears:  Normal auditory acuity. Cardiovascular:  S1, S2 present with systolic murmur appreciated. . Extremities without clubbing or edema noted. Respiratory:  Clear to auscultation bilaterally. No wheezes, rales, or rhonchi. No distress.  Gastrointestinal:   +BS, rounded but soft, non-tender and non-distended. No HSM noted. No guarding or rebound. No masses appreciated.  Rectal:  Deferred  Musculoskalatal:  Hobbling gait noted, denies pain. Skin:  Intact without significant lesions or rashes. Neurologic:  Alert and oriented x4;  grossly normal neurologically. Psych:  Alert and cooperative. Normal mood and affect. Heme/Lymph/Immune: No excessive bruising noted.    11/22/2014 10:43 AM

## 2014-12-09 NOTE — Anesthesia Procedure Notes (Signed)
Procedure Name: MAC Date/Time: 12/09/2014 9:43 AM Performed by: Shary Decamp Pre-anesthesia Checklist: Patient identified, Emergency Drugs available, Suction available, Timeout performed and Patient being monitored Patient Re-evaluated:Patient Re-evaluated prior to inductionOxygen Delivery Method: Nasal Cannula

## 2014-12-10 ENCOUNTER — Encounter (HOSPITAL_COMMUNITY): Payer: Self-pay | Admitting: Internal Medicine

## 2015-06-29 ENCOUNTER — Ambulatory Visit (INDEPENDENT_AMBULATORY_CARE_PROVIDER_SITE_OTHER): Payer: Medicare Other | Admitting: Orthopedic Surgery

## 2015-06-29 ENCOUNTER — Ambulatory Visit (INDEPENDENT_AMBULATORY_CARE_PROVIDER_SITE_OTHER): Payer: Medicare Other

## 2015-06-29 VITALS — BP 159/97 | HR 76 | Ht 69.0 in | Wt 240.4 lb

## 2015-06-29 DIAGNOSIS — M25511 Pain in right shoulder: Secondary | ICD-10-CM | POA: Diagnosis not present

## 2015-06-29 DIAGNOSIS — M7541 Impingement syndrome of right shoulder: Secondary | ICD-10-CM

## 2015-06-29 NOTE — Patient Instructions (Signed)
Shoulder Range of Motion Exercises Shoulder range of motion (ROM) exercises are designed to keep the shoulder moving freely. They are often recommended for people who have shoulder pain. MOVEMENT EXERCISE When you are able, do this exercise 5-6 days per week, or as told by your health care provider. Work toward doing 2 sets of 10 swings. Pendulum Exercise How To Do This Exercise Lying Down 1. Lie face-down on a bed with your abdomen close to the side of the bed. 2. Let your arm hang over the side of the bed. 3. Relax your shoulder, arm, and hand. 4. Slowly and gently swing your arm forward and back. Do not use your neck muscles to swing your arm. They should be relaxed. If you are struggling to swing your arm, have someone gently swing it for you. When you do this exercise for the first time, swing your arm at a 15 degree angle for 15 seconds, or swing your arm 10 times. As pain lessens over time, increase the angle of the swing to 30-45 degrees. 5. Repeat steps 1-4 with the other arm. How To Do This Exercise While Standing 1. Stand next to a sturdy chair or table and hold on to it with your hand.  Bend forward at the waist.  Bend your knees slightly.  Relax your other arm and let it hang limp.  Relax the shoulder blade of the arm that is hanging and let it drop.  While keeping your shoulder relaxed, use body motion to swing your arm in small circles. The first time you do this exercise, swing your arm for about 30 seconds or 10 times. When you do it next time, swing your arm for a little longer.  Stand up tall and relax.  Repeat steps 1-7, this time changing the direction of the circles. 2. Repeat steps 1-8 with the other arm. STRETCHING EXERCISES Do these exercises 3-4 times per day on 5-6 days per week or as told by your health care provider. Work toward holding the stretch for 20 seconds. Stretching Exercise 1 1. Lift your arm straight out in front of you. 2. Bend your arm 90  degrees at the elbow (right angle) so your forearm goes across your body and looks like the letter "L." 3. Use your other arm to gently pull the elbow forward and across your body. 4. Repeat steps 1-3 with the other arm. Stretching Exercise 2 You will need a towel or rope for this exercise. 1. Bend one arm behind your back with the palm facing outward. 2. Hold a towel with your other hand. 3. Reach the arm that holds the towel above your head, and bend that arm at the elbow. Your wrist should be behind your neck. 4. Use your free hand to grab the free end of the towel. 5. With the higher hand, gently pull the towel up behind you. 6. With the lower hand, pull the towel down behind you. 7. Repeat steps 1-6 with the other arm. STRENGTHENING EXERCISES Do each of these exercises at four different times of day (sessions) every day or as told by your health care provider. To begin with, repeat each exercise 5 times (repetitions). Work toward doing 3 sets of 12 repetitions or as told by your health care provider. Strengthening Exercise 1 You will need a light weight for this activity. As you grow stronger, you may use a heavier weight. 1. Standing with a weight in your hand, lift your arm straight out to the side   until it is at the same height as your shoulder. 2. Bend your arm at 90 degrees so that your fingers are pointing to the ceiling. 3. Slowly raise your hand until your arm is straight up in the air. 4. Repeat steps 1-3 with the other arm. Strengthening Exercise 2 You will need a light weight for this activity. As you grow stronger, you may use a heavier weight. 1. Standing with a weight in your hand, gradually move your straight arm in an arc, starting at your side, then out in front of you, then straight up over your head. 2. Gradually move your other arm in an arc, starting at your side, then out in front of you, then straight up over your head. 3. Repeat steps 1-2 with the other  arm. Strengthening Exercise 3 You will need an elastic band for this activity. As you grow stronger, gradually increase the size of the bands or increase the number of bands that you use at one time. 1. While standing, hold an elastic band in one hand and raise that arm up in the air. 2. With your other hand, pull down the band until that hand is by your side. 3. Repeat steps 1-2 with the other arm.   This information is not intended to replace advice given to you by your health care provider. Make sure you discuss any questions you have with your health care provider.   Document Released: 12/09/2002 Document Revised: 07/27/2014 Document Reviewed: 03/08/2014 Elsevier Interactive Patient Education 2016 [employer]  

## 2015-06-30 NOTE — Progress Notes (Signed)
Patient ID: Stanley Wall, male   DOB: [birth year], 70 y.o.   MRN: [id number]  Chief Complaint  Patient presents with  . Shoulder Pain    Right shoulder pain   HPI Stanley Wall is a 70 y.o. male.  Right shoulder pain actually improving. No trauma. Quality dull ache severity mild grade 2 out of 10 duration several weeks timing intermittent and related to position of the shoulder  The patient reports he did get an injection of the shoulder which did help and his shoulders actually better he just wanted it checked out again  Review of Systems Review of Systems  Constitutional: Negative for fever and chills.  Neurological: Negative for weakness and numbness.    Past Medical History  Diagnosis Date  . Hypertension   . Anxiety   . Hypercholesterolemia   . Depression   . Cerumen impaction   . Obesity   . Prostate enlargement   . Stroke     states in left eye but no damage.    Past Surgical History  Procedure Laterality Date  . Colonoscopy  2006    Normal rectum/colon, anal canal hemorrhoids  . Total hip arthroplasty Left 1997  . Colonoscopy with propofol N/A 12/09/2014    Procedure: COLONOSCOPY WITH PROPOFOL;  Surgeon: Daneil Dolin, MD;  Location: [facility];  Service: Endoscopy;  Laterality: N/A;  Cecum time in  0953  time out  1000   total time 7 minutes    Family History  Problem Relation Age of Onset  . Colon cancer Neg Hx     Social History Social History  Substance Use Topics  . Smoking status: Never Smoker   . Smokeless tobacco: Never Used  . Alcohol Use: No    No Known Allergies  Current Outpatient Prescriptions  Medication Sig Dispense Refill  . ALPRAZolam (XANAX) 0.5 MG tablet Take 0.5 mg by mouth 2 (two) times daily.    Marland Kitchen amLODipine (NORVASC) 10 MG tablet Take 10 mg by mouth daily.  1  . atorvastatin (LIPITOR) 10 MG tablet Take 10 mg by mouth daily.  0  . betamethasone dipropionate (DIPROLENE) 0.05 % cream Apply topically 2 (two) times daily as needed.   1  . celecoxib (CELEBREX) 200 MG capsule Take 200 mg by mouth 2 (two) times daily.   3  . mupirocin ointment (BACTROBAN) 2 % Place 1 application into the nose 2 (two) times daily.    Marland Kitchen NAFTIN 1 % GEL APPLY GEL TO FEET DAILY AS DIRECTED  11  . QUEtiapine (SEROQUEL) 100 MG tablet Take 100 mg by mouth 2 (two) times daily.  3  . risperiDONE (RISPERDAL) 2 MG tablet Take 2 mg by mouth at bedtime.  0  . tamsulosin (FLOMAX) 0.4 MG CAPS capsule Take 0.4 mg by mouth daily. Reported on 06/29/2015  3  . valsartan (DIOVAN) 160 MG tablet Take 160 mg by mouth daily.  3  . peg 3350 powder (MOVIPREP) 100 G SOLR Take 1 kit (200 g total) by mouth as directed. (Patient not taking: Reported on 06/29/2015) 1 kit 0   No current facility-administered medications for this visit.    Physical Exam BP 159/97 mmHg  Pulse 76  Ht 5' 9"  (1.753 m)  Wt 240 lb 6.4 oz (109.045 kg)  BMI 35.48 kg/m2 Physical Exam  Constitutional: He is oriented to person, place, and time. He appears well-developed and well-nourished. No distress.  Cardiovascular: Normal rate and intact distal pulses.   Neurological: He is  alert and oriented to person, place, and time.  Skin: Skin is warm and dry. No rash noted. He is not diaphoretic. No erythema. No pallor.  Psychiatric: He has a normal mood and affect. His behavior is normal. Judgment and thought content normal.   Ambulatory status normal with no assistive devices however he has a noticeable limp and he had  hip surgery   Right Shoulder Exam  Right shoulder exam is normal.  Tenderness  Right shoulder tenderness location: Mild peri-acromial posterior and anterior soft tissue.  Range of Motion  Active Abduction: 90  Passive Abduction: 110  Extension: 40  Forward Flexion: 160  External Rotation: 50   Muscle Strength  The patient has normal right shoulder strength.  Tests  Apprehension: negative Drop Arm: negative Hawkin's test: negative Impingement: negative  Other   Erythema: absent Sensation: normal Pulse: present   Left Shoulder Exam  Left shoulder exam is normal.  Tenderness  The patient is experiencing no tenderness.     Range of Motion  The patient has normal left shoulder ROM.  Muscle Strength  The patient has normal left shoulder strength.  Tests  Apprehension: negative Hawkin's test: negative Impingement: negative  Other  Erythema: absent Sensation: normal Pulse: present        Data Reviewed Imaging of the right shoulder are independently reviewed and I interpreted these as large anterior acromial spur impinging on the rotator outlet with normal glenohumeral joint  Assessment  Impingement syndrome right shoulder improved   Plan  Shoulder exercise program return if no improvement

## 2016-05-25 ENCOUNTER — Other Ambulatory Visit (HOSPITAL_COMMUNITY): Payer: Self-pay | Admitting: Nephrology

## 2016-05-25 DIAGNOSIS — N183 Chronic kidney disease, stage 3 unspecified: Secondary | ICD-10-CM

## 2016-06-04 ENCOUNTER — Ambulatory Visit (HOSPITAL_COMMUNITY)
Admission: RE | Admit: 2016-06-04 | Discharge: 2016-06-04 | Disposition: A | Discharge status: Home / Self Care | Payer: Medicare Other | Source: Ambulatory Visit | Attending: Nephrology | Admitting: Nephrology

## 2016-06-04 ENCOUNTER — Other Ambulatory Visit (HOSPITAL_COMMUNITY)
Admission: RE | Admit: 2016-06-04 | Discharge: 2016-06-04 | Disposition: A | Discharge status: Home / Self Care | Payer: Medicare Other | Source: Ambulatory Visit | Attending: Nephrology | Admitting: Nephrology

## 2016-06-04 DIAGNOSIS — N183 Chronic kidney disease, stage 3 unspecified: Secondary | ICD-10-CM

## 2016-06-04 DIAGNOSIS — N281 Cyst of kidney, acquired: Secondary | ICD-10-CM | POA: Diagnosis not present

## 2017-04-10 ENCOUNTER — Ambulatory Visit: Payer: Medicare Other | Admitting: Urology

## 2017-04-10 ENCOUNTER — Other Ambulatory Visit: Payer: Self-pay | Admitting: Urology

## 2017-04-10 DIAGNOSIS — R972 Elevated prostate specific antigen [PSA]: Secondary | ICD-10-CM

## 2017-05-01 ENCOUNTER — Encounter (HOSPITAL_COMMUNITY): Payer: Self-pay

## 2017-05-01 ENCOUNTER — Ambulatory Visit (HOSPITAL_COMMUNITY)
Admission: RE | Admit: 2017-05-01 | Discharge: 2017-05-01 | Disposition: A | Discharge status: Home / Self Care | Payer: Medicare Other | Source: Ambulatory Visit | Attending: Urology | Admitting: Urology

## 2017-05-01 ENCOUNTER — Ambulatory Visit (HOSPITAL_COMMUNITY): Admission: RE | Admit: 2017-05-01 | Payer: Medicare Other | Source: Ambulatory Visit

## 2017-05-01 DIAGNOSIS — R972 Elevated prostate specific antigen [PSA]: Secondary | ICD-10-CM | POA: Insufficient documentation

## 2017-05-01 MED ORDER — LIDOCAINE HCL (PF) 2 % IJ SOLN
INTRAMUSCULAR | Status: AC
  Administered 2017-05-01: 10 mL
  Filled 2017-05-01: qty 10

## 2017-05-01 MED ORDER — LIDOCAINE HCL (PF) 2 % IJ SOLN
10.0000 mL | Freq: Once | INTRAMUSCULAR | Status: AC
  Administered 2017-05-01: 10 mL

## 2017-05-01 MED ORDER — GENTAMICIN SULFATE 40 MG/ML IJ SOLN
INTRAMUSCULAR | Status: AC
  Administered 2017-05-01: 80 mg via INTRAMUSCULAR
  Filled 2017-05-01: qty 2

## 2017-05-01 MED ORDER — GENTAMICIN SULFATE 40 MG/ML IJ SOLN
80.0000 mg | Freq: Once | INTRAMUSCULAR | Status: AC
  Administered 2017-05-01: 80 mg via INTRAMUSCULAR

## 2017-05-01 NOTE — Discharge Instructions (Signed)
Transrectal Ultrasound-Guided Biopsy °A transrectal ultrasound-guided biopsy is a procedure to remove samples of tissue from your prostate using ultrasound images to guide the procedure. The procedure is usually done to evaluate the prostate gland of men who have an elevated prostate-specific antigen (PSA). PSA is a blood test to screen for prostate cancer. The biopsy samples are taken to check for prostate cancer. °Tell a health care provider about: °· Any allergies you have. °· All medicines you are taking, including vitamins, herbs, eye drops, creams, and over-the-counter medicines. °· Any problems you or family members have had with anesthetic medicines. °· Any blood disorders you have. °· Any surgeries you have had. °· Any medical conditions you have. °What are the risks? °Generally, this is a safe procedure. However, as with any procedure, problems can occur. Possible problems include: °· Infection of your prostate. °· Bleeding from your rectum or blood in your urine. °· Difficulty urinating. °· Nerve damage (this is usually temporary). °· Damage to surrounding structures such as blood vessels, organs, and muscles, which would require other procedures. ° °What happens before the procedure? °· Do not eat or drink anything after midnight on the night before the procedure or as directed by your health care provider. °· Take medicines only as directed by your health care provider. °· Your health care provider may have you stop taking certain medicines 5-7 days before the procedure. °· You will be given an enema before the procedure. During an enema, a liquid is injected into your rectum to clear out waste. °· You may have lab tests the day of your procedure. °· Plan to have someone take you home after the procedure. °What happens during the procedure? °· You will be given medicine to help you relax (sedative) before the procedure. An IV tube will be inserted into one of your veins and used to give fluids and  medicine. °· You will be given antibiotic medicine to reduce the risk of an infection. °· You will be placed on your side for the procedure. °· A probe with lubricated gel will be placed into your rectum, and images will be taken of your prostate and surrounding structures. °· Numbing medicine will be injected into the prostate before the biopsy samples are taken. °· A biopsy needle will then be inserted and guided to your prostate with the use of the ultrasound images. °· Samples of prostate tissue will be taken, and the needle will then be removed. °· The biopsy samples will be sent to a lab to be analyzed. Results are usually back in 2-3 days. °What happens after the procedure? °· You will be taken to a recovery area where you will be monitored. °· You may have some discomfort in the rectal area. You will be given pain medicines to control this. °· You may be allowed to go home the same day, or you may need to stay in the hospital overnight. °This information is not intended to replace advice given to you by your health care provider. Make sure you discuss any questions you have with your health care provider. °Document Released: 07/27/2013 Document Revised: 08/18/2015 Document Reviewed: 10/29/2012 °[employer] Interactive Patient Education © 2018 [employer] ° °

## 2017-05-15 ENCOUNTER — Ambulatory Visit: Payer: Medicare Other | Admitting: Urology

## 2017-05-15 DIAGNOSIS — R972 Elevated prostate specific antigen [PSA]: Secondary | ICD-10-CM

## 2017-05-15 DIAGNOSIS — R3912 Poor urinary stream: Secondary | ICD-10-CM

## 2017-11-13 ENCOUNTER — Ambulatory Visit: Payer: Medicare Other | Admitting: Urology

## 2017-11-13 DIAGNOSIS — R972 Elevated prostate specific antigen [PSA]: Secondary | ICD-10-CM | POA: Diagnosis not present

## 2017-11-13 DIAGNOSIS — R3912 Poor urinary stream: Secondary | ICD-10-CM | POA: Diagnosis not present

## 2017-11-15 ENCOUNTER — Other Ambulatory Visit: Payer: Self-pay | Admitting: Urology

## 2017-11-15 DIAGNOSIS — R972 Elevated prostate specific antigen [PSA]: Secondary | ICD-10-CM

## 2017-11-27 ENCOUNTER — Ambulatory Visit
Admission: RE | Admit: 2017-11-27 | Discharge: 2017-11-27 | Disposition: A | Discharge status: Home / Self Care | Payer: Medicare Other | Source: Ambulatory Visit | Attending: Urology | Admitting: Urology

## 2017-11-27 DIAGNOSIS — R972 Elevated prostate specific antigen [PSA]: Secondary | ICD-10-CM

## 2017-12-06 ENCOUNTER — Other Ambulatory Visit: Payer: Self-pay | Admitting: Urology

## 2017-12-06 DIAGNOSIS — R972 Elevated prostate specific antigen [PSA]: Secondary | ICD-10-CM

## 2017-12-11 ENCOUNTER — Encounter (HOSPITAL_COMMUNITY): Payer: Self-pay

## 2017-12-11 ENCOUNTER — Ambulatory Visit (HOSPITAL_COMMUNITY)
Admission: RE | Admit: 2017-12-11 | Discharge: 2017-12-11 | Disposition: A | Discharge status: Home / Self Care | Payer: Medicare Other | Source: Ambulatory Visit | Attending: Urology | Admitting: Urology

## 2017-12-11 DIAGNOSIS — R972 Elevated prostate specific antigen [PSA]: Secondary | ICD-10-CM | POA: Diagnosis not present

## 2017-12-11 MED ORDER — GADOBUTROL 1 MMOL/ML IV SOLN
9.5000 mL | Freq: Once | INTRAVENOUS | Status: AC | PRN
  Administered 2017-12-11: 9.5 mL via INTRAVENOUS

## 2017-12-25 ENCOUNTER — Ambulatory Visit: Payer: Medicare Other | Admitting: Urology

## 2017-12-25 DIAGNOSIS — R3912 Poor urinary stream: Secondary | ICD-10-CM | POA: Diagnosis not present

## 2017-12-25 DIAGNOSIS — R972 Elevated prostate specific antigen [PSA]: Secondary | ICD-10-CM

## 2018-07-16 ENCOUNTER — Ambulatory Visit (INDEPENDENT_AMBULATORY_CARE_PROVIDER_SITE_OTHER): Payer: Medicare Other | Admitting: Urology

## 2018-07-16 DIAGNOSIS — R972 Elevated prostate specific antigen [PSA]: Secondary | ICD-10-CM | POA: Diagnosis not present

## 2018-07-16 DIAGNOSIS — R3912 Poor urinary stream: Secondary | ICD-10-CM

## 2019-01-21 ENCOUNTER — Ambulatory Visit (INDEPENDENT_AMBULATORY_CARE_PROVIDER_SITE_OTHER): Payer: Medicare Other | Admitting: Urology

## 2019-01-21 DIAGNOSIS — R972 Elevated prostate specific antigen [PSA]: Secondary | ICD-10-CM

## 2019-01-21 DIAGNOSIS — R3912 Poor urinary stream: Secondary | ICD-10-CM

## 2019-04-06 ENCOUNTER — Other Ambulatory Visit: Payer: Self-pay

## 2019-04-06 DIAGNOSIS — R972 Elevated prostate specific antigen [PSA]: Secondary | ICD-10-CM

## 2020-02-12 ENCOUNTER — Other Ambulatory Visit: Payer: Self-pay | Admitting: Urology

## 2020-06-09 IMAGING — MR MR PROSTATE WO/W CM
23 of 56 series · 23 of 56 positions shown · IV contrast (yes)
Comparison: None.

CLINICAL DATA: Elevated PSA.  Previous negative prostate biopsy.

EXAM:
MR PROSTATE WITHOUT AND WITH CONTRAST
TECHNIQUE: Multiplanar multisequence MRI images were obtained of the pelvis
centered about the prostate. Pre and post contrast images were
obtained.
CONTRAST:  10 mL Gadavist

[Series 4: bSSFP fat-sat · axial · 6.0mm · 0.86mm/px · 1 of 44 slices shown]
[im 1/44]
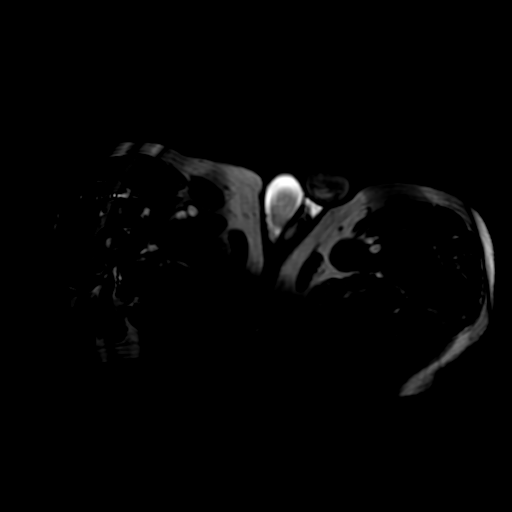

[Series 5: T1 · axial · 6.0mm · 0.86mm/px · 1 of 36 slices shown (1 of 2)]
[im 1/36]
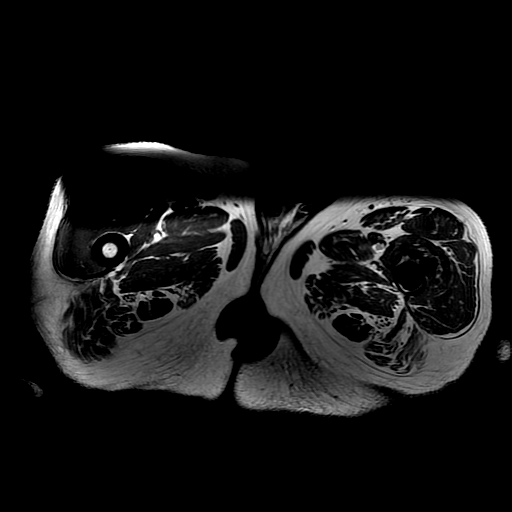

[Series 6: T2 · axial · 3.0mm · 0.29mm/px · 1 of 24 slices shown (1 of 4)]
[im 1/24]
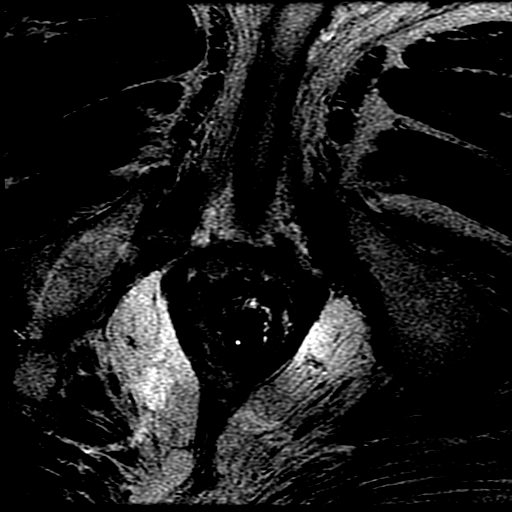

[Series 7: T1 · axial · 3.0mm · 0.29mm/px · 1 of 24 slices shown (2 of 2)]
[im 1/24]
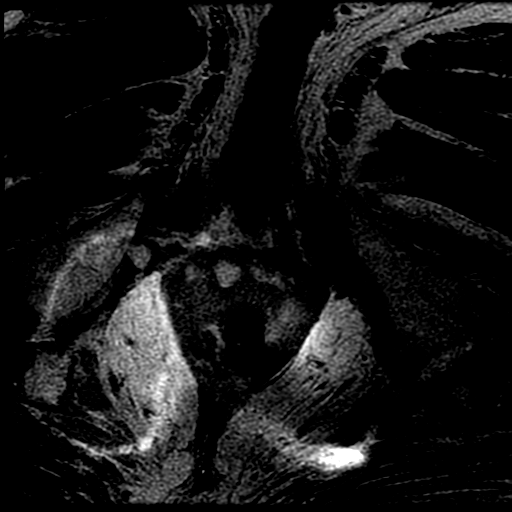

[Series 8: T2 · sagittal · 4.0mm · 0.29mm/px · 1 of 24 slices shown (2 of 4)]
[im 1/24]
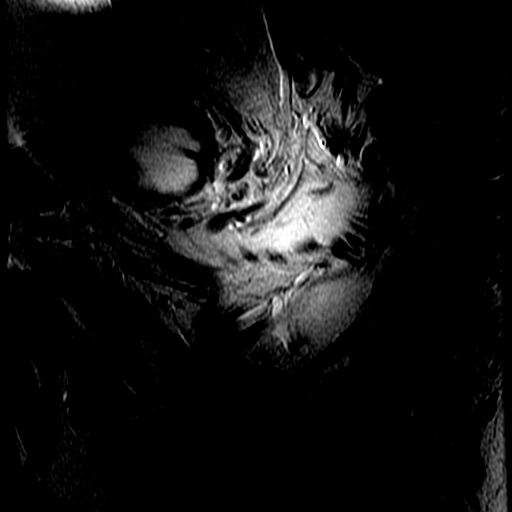

[Series 9: T2 · axial · 1.8mm · 0.47mm/px · 1 of 156 slices shown (3 of 4)]
[im 1/156]
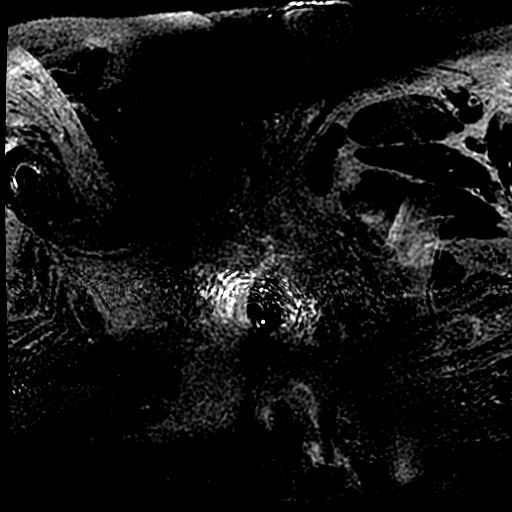

[Series 10: T2 · coronal · 4.0mm · 0.29mm/px · 1 of 19 slices shown (4 of 4)]
[im 1/19]
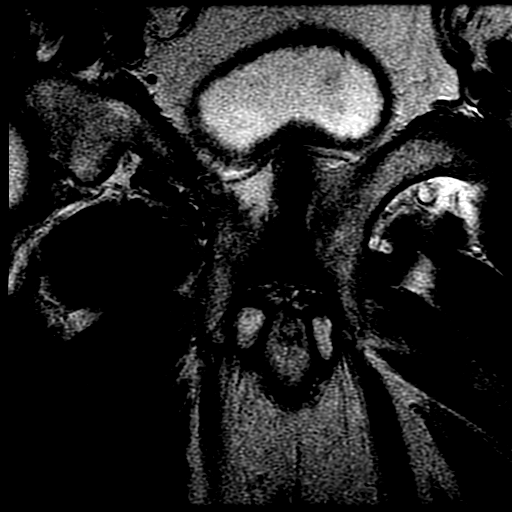

[Series 11: DWI · axial · 3.0mm · 0.59mm/px · 1 of 44 slices shown (1 of 6)]
[im 1/44]
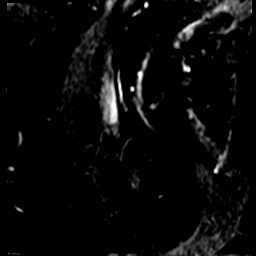

[Series 12: DWI · axial · 3.0mm · 0.59mm/px · 1 of 41 slices shown (2 of 6)]
[im 1/41]
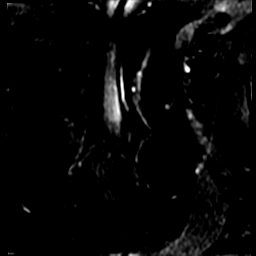

[Series 13: DWI · axial · 3.0mm · 0.59mm/px · 1 of 41 slices shown (3 of 6)]
[im 1/41]
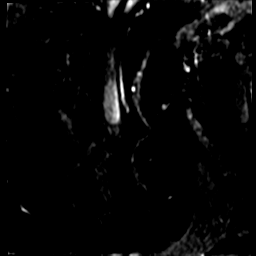

[Series 900: reformatted · axial · 1.8mm · 0.47mm/px · 1 of 70 slices shown (1 of 2)]
[im 1/70]
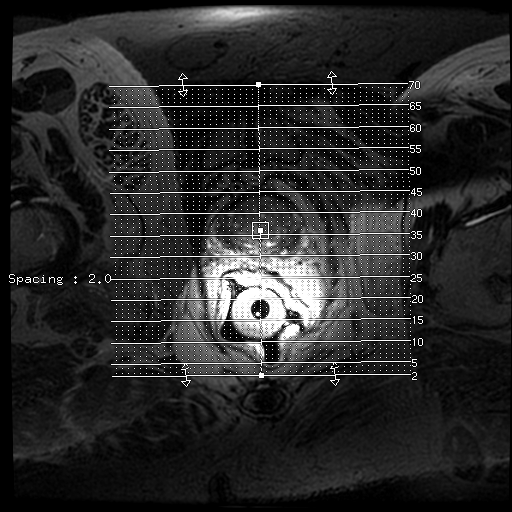

[Series 901: reformatted · sagittal · 2.0mm · 0.43mm/px · 1 of 63 slices shown (2 of 2)]
[im 1/63]
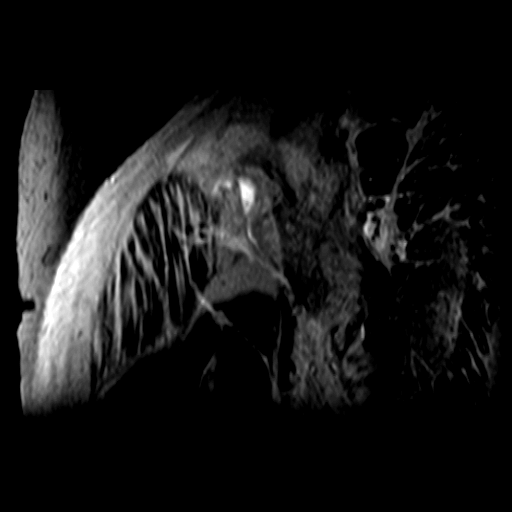

[Series 1100: DWI · axial · 3.0mm · 0.59mm/px · 1 of 24 slices shown (4 of 6)]
[im 1/24]
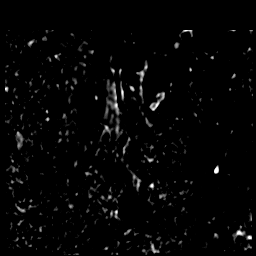

[Series 1200: DWI · axial · 3.0mm · 0.59mm/px · 1 of 24 slices shown (5 of 6)]
[im 1/24]
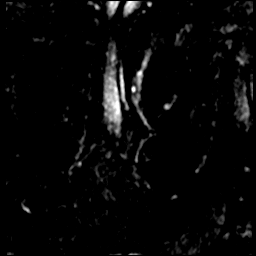

[Series 1300: DWI · axial · 3.0mm · 0.59mm/px · 1 of 24 slices shown (6 of 6)]
[im 1/24]
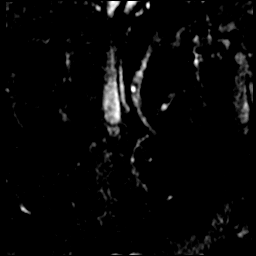

[((id)/(id)/1)-((id)/(id)/1) · axial · 3.0mm · 0.43mm/px · 1 of 75 slices shown (1 of 8)]
[im 1/75]
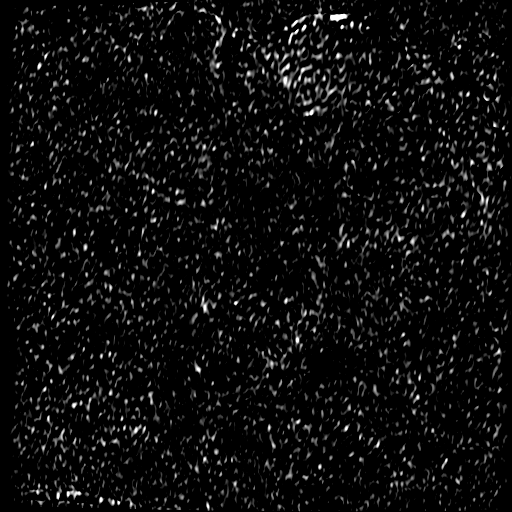

[((id)/(id)/1)-((id)/(id)/1) · axial · 3.0mm · 0.43mm/px · 1 of 76 slices shown (2 of 8)]
[im 1/76]
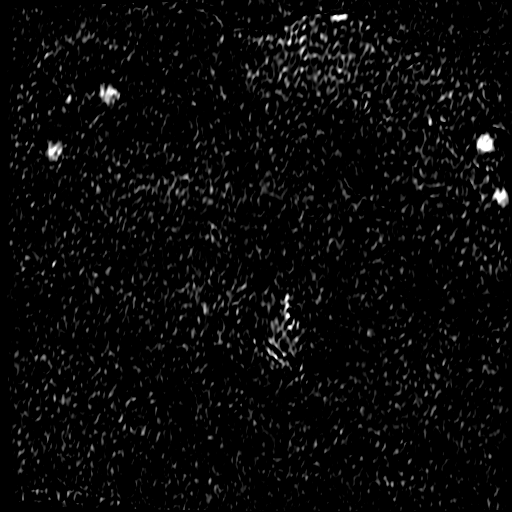

[((id)/(id)/1)-((id)/(id)/1) · axial · 3.0mm · 0.43mm/px · 1 of 75 slices shown (3 of 8)]
[im 1/75]
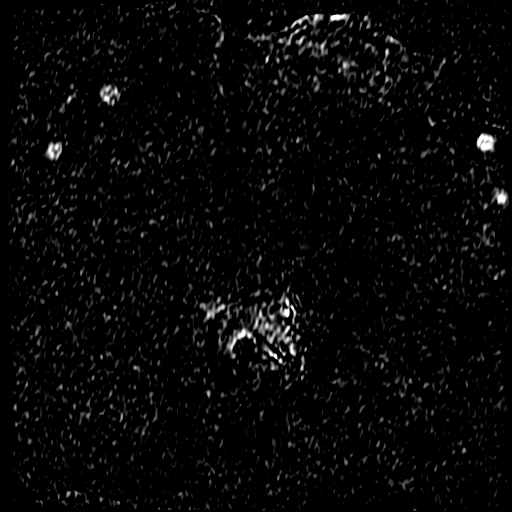

[((id)/(id)/1)-((id)/(id)/1) · axial · 3.0mm · 0.43mm/px · 1 of 76 slices shown (4 of 8)]
[im 1/76]
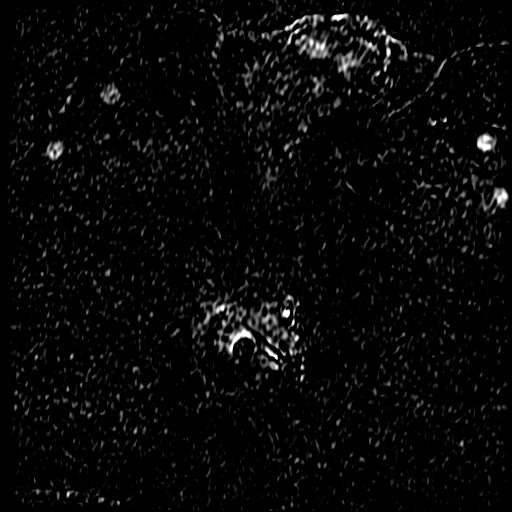

[((id)/(id)/1)-((id)/(id)/1) · axial · 3.0mm · 0.43mm/px · 1 of 75 slices shown (5 of 8)]
[im 1/75]
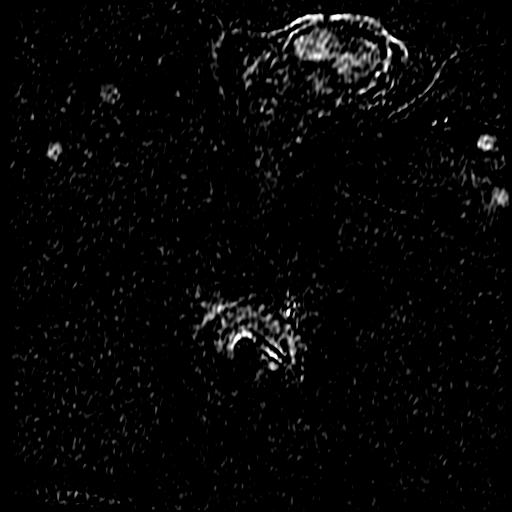

[((id)/(id)/1)-((id)/(id)/1) · axial · 3.0mm · 0.43mm/px · 1 of 76 slices shown (6 of 8)]
[im 1/76]
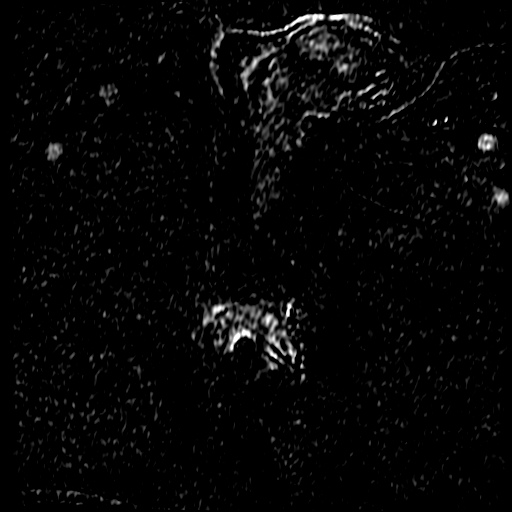

[((id)/(id)/1)-((id)/(id)/1) · axial · 3.0mm · 0.43mm/px · 1 of 76 slices shown (7 of 8)]
[im 1/76]
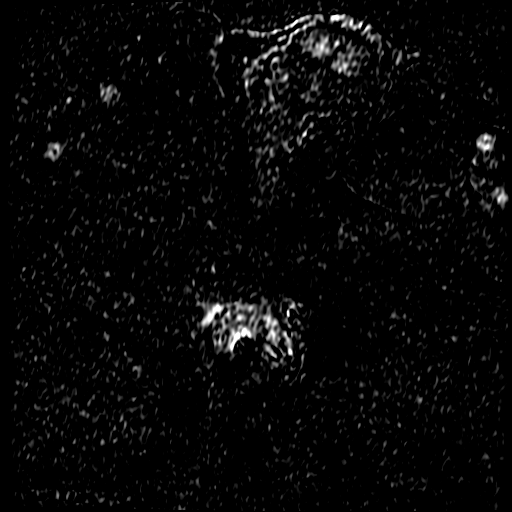

[((id)/(id)/1)-((id)/(id)/1) · axial · 3.0mm · 0.43mm/px · 1 of 76 slices shown (8 of 8)]
[im 1/76]
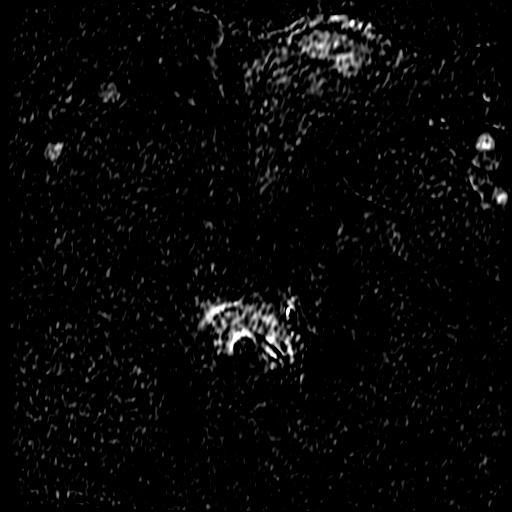

[23 of 56 positions shown; findings below may reference images not displayed]

FINDINGS: Prostate:

-- Peripheral Zone: No abnormality seen on ADC and high b-value DWI
sequences.

-- Transition/Central Zone: Circumscribed BPH nodules noted, but no
suspicious nodules with obscured or non-circumscribed margins seen.
Median lobe hypertrophy indents the bladder base.

-- Volume:  5.2 x 3.7 x 5.1 cm (volume = 51 cm^3)

Transcapsular spread:  Absent

Seminal vesicle involvement:  Absent

Neurovascular bundle involvement:  Absent

Pelvic adenopathy: None visualized

Bone metastasis: None visualized.  Left hip prosthesis noted.

Other: Mild diffuse bladder wall thickening, consistent with chronic
bladder outlet obstruction.
IMPRESSION: No radiographic evidence of high-grade prostate carcinoma. PI-RADS
1: Very Low (clinically significant cancer is highly unlikely to be
present)
